# Patient Record
Sex: Female | Born: 1950 | Race: White | Hispanic: No | Marital: Married | Smoking: Never smoker
Health system: Southern US, Community
[De-identification: ages and names within clinical notes are randomized; demographics above are authoritative.]

## PROBLEM LIST (undated history)

## (undated) DIAGNOSIS — F0390 Unspecified dementia without behavioral disturbance: Secondary | ICD-10-CM

## (undated) DIAGNOSIS — T7840XA Allergy, unspecified, initial encounter: Secondary | ICD-10-CM

## (undated) DIAGNOSIS — M545 Low back pain, unspecified: Secondary | ICD-10-CM

## (undated) DIAGNOSIS — I10 Essential (primary) hypertension: Secondary | ICD-10-CM

## (undated) DIAGNOSIS — G4700 Insomnia, unspecified: Secondary | ICD-10-CM

## (undated) DIAGNOSIS — K219 Gastro-esophageal reflux disease without esophagitis: Secondary | ICD-10-CM

## (undated) DIAGNOSIS — F419 Anxiety disorder, unspecified: Secondary | ICD-10-CM

## (undated) HISTORY — DX: Low back pain: M54.5

## (undated) HISTORY — DX: Essential (primary) hypertension: I10

## (undated) HISTORY — PX: CATARACT EXTRACTION, BILATERAL: SHX1313

## (undated) HISTORY — DX: Low back pain, unspecified: M54.50

## (undated) HISTORY — DX: Gastro-esophageal reflux disease without esophagitis: K21.9

## (undated) HISTORY — DX: Unspecified dementia, unspecified severity, without behavioral disturbance, psychotic disturbance, mood disturbance, and anxiety: F03.90

## (undated) HISTORY — DX: Allergy, unspecified, initial encounter: T78.40XA

## (undated) HISTORY — DX: Anxiety disorder, unspecified: F41.9

## (undated) HISTORY — DX: Insomnia, unspecified: G47.00

---

## 2002-07-02 ENCOUNTER — Encounter: Payer: Self-pay | Admitting: Internal Medicine

## 2002-07-02 ENCOUNTER — Encounter: Admission: RE | Admit: 2002-07-02 | Discharge: 2002-07-02 | Payer: Self-pay | Admitting: Internal Medicine

## 2003-06-25 ENCOUNTER — Emergency Department (HOSPITAL_COMMUNITY): Admission: EM | Admit: 2003-06-25 | Discharge: 2003-06-25 | Payer: Self-pay | Admitting: Emergency Medicine

## 2003-07-02 ENCOUNTER — Encounter: Admission: RE | Admit: 2003-07-02 | Discharge: 2003-07-02 | Payer: Self-pay | Admitting: Internal Medicine

## 2004-02-11 ENCOUNTER — Other Ambulatory Visit: Admission: RE | Admit: 2004-02-11 | Discharge: 2004-02-11 | Payer: Self-pay | Admitting: Internal Medicine

## 2004-04-15 ENCOUNTER — Ambulatory Visit (HOSPITAL_COMMUNITY): Admission: RE | Admit: 2004-04-15 | Discharge: 2004-04-15 | Payer: Self-pay | Admitting: Gastroenterology

## 2004-04-15 ENCOUNTER — Encounter (INDEPENDENT_AMBULATORY_CARE_PROVIDER_SITE_OTHER): Payer: Self-pay | Admitting: Specialist

## 2004-07-02 ENCOUNTER — Encounter: Admission: RE | Admit: 2004-07-02 | Discharge: 2004-07-02 | Payer: Self-pay | Admitting: Internal Medicine

## 2005-07-06 ENCOUNTER — Encounter: Admission: RE | Admit: 2005-07-06 | Discharge: 2005-07-06 | Payer: Self-pay | Admitting: Internal Medicine

## 2006-03-15 HISTORY — PX: CHOLECYSTECTOMY: SHX55

## 2006-07-08 ENCOUNTER — Encounter: Admission: RE | Admit: 2006-07-08 | Discharge: 2006-07-08 | Payer: Self-pay | Admitting: Internal Medicine

## 2006-10-04 ENCOUNTER — Encounter: Admission: RE | Admit: 2006-10-04 | Discharge: 2006-10-04 | Payer: Self-pay | Admitting: Internal Medicine

## 2007-07-10 ENCOUNTER — Encounter: Admission: RE | Admit: 2007-07-10 | Discharge: 2007-07-10 | Payer: Self-pay | Admitting: Internal Medicine

## 2008-03-26 ENCOUNTER — Ambulatory Visit: Payer: Self-pay | Admitting: Internal Medicine

## 2008-04-25 ENCOUNTER — Ambulatory Visit: Payer: Self-pay | Admitting: Internal Medicine

## 2008-04-29 IMAGING — CR DG SINUSES COMPLETE 3+V
3 series · 3 of 3 positions shown · non-contrast
Comparison: none

CLINICAL DATA: Chronic sinus and right nasal congestion.  History of deviated septum. 
 DIAGNOSTIC SINUSES COMPLETE - 3 VIEW: 
 No comparison.

[view not recorded (1 of 3)]
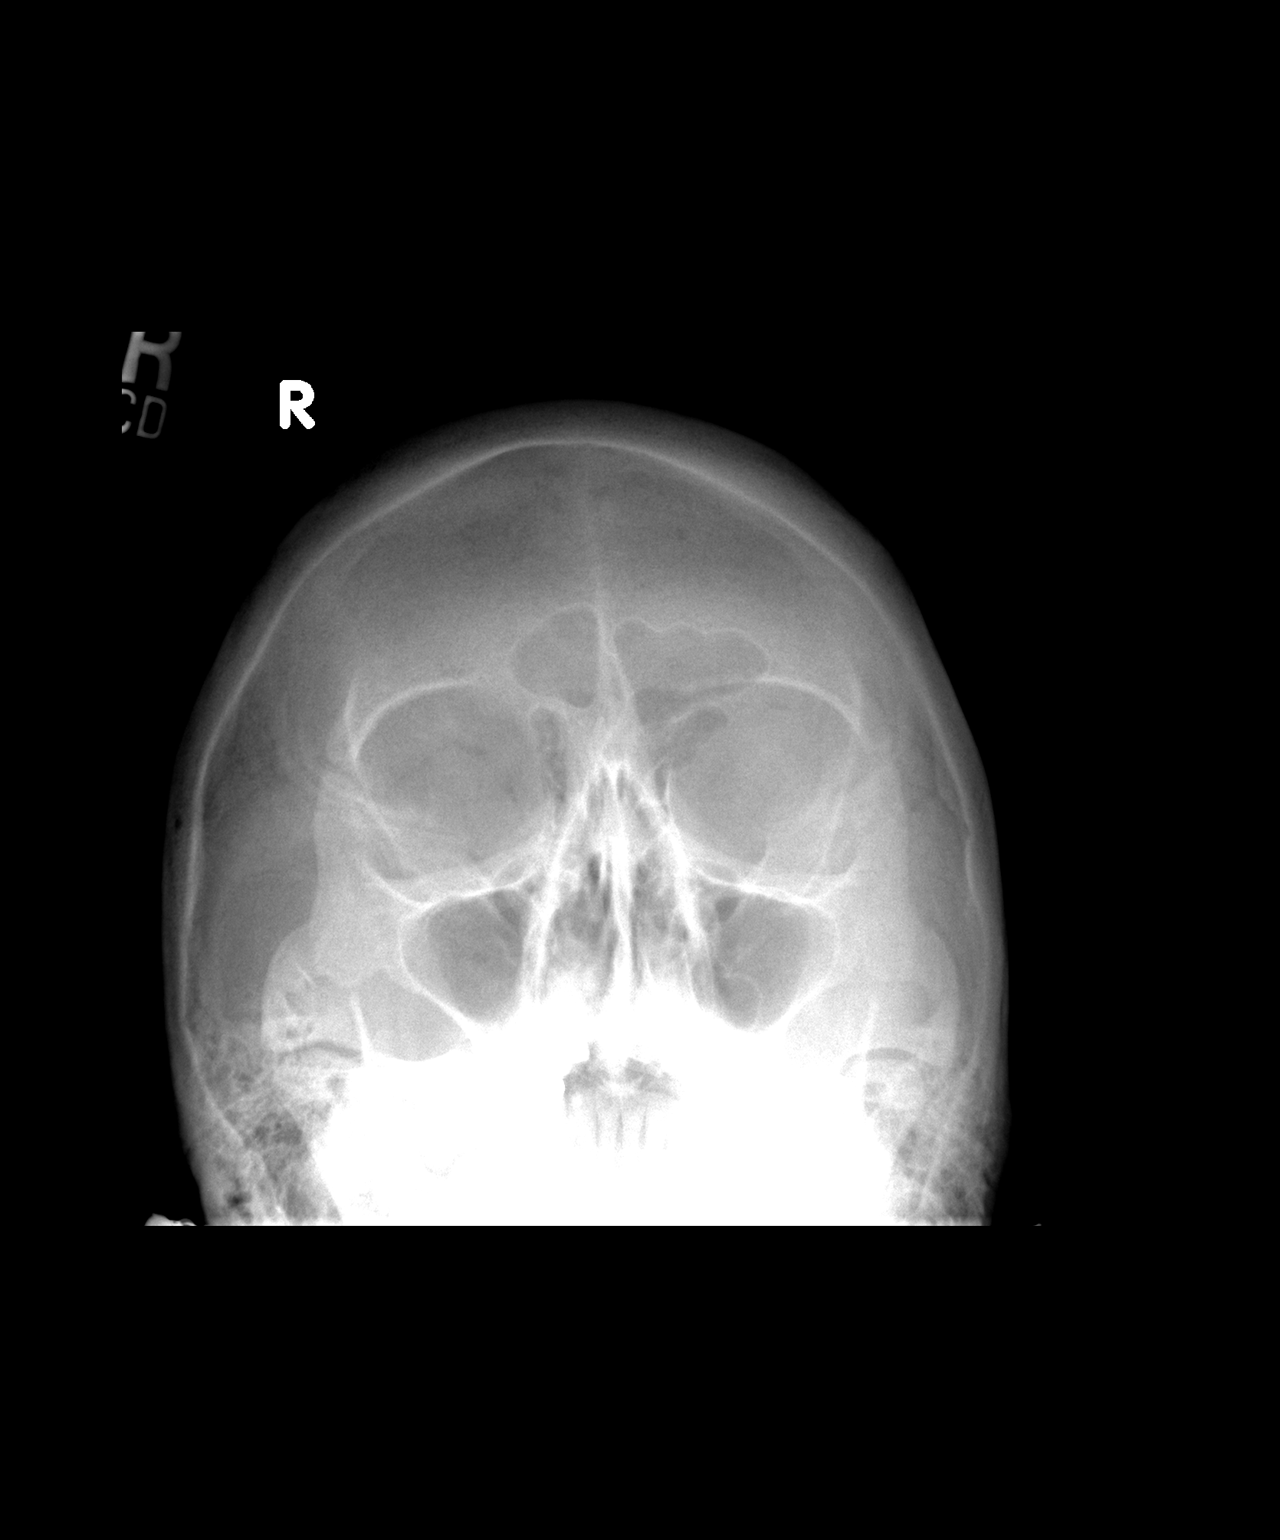

[view not recorded (2 of 3)]
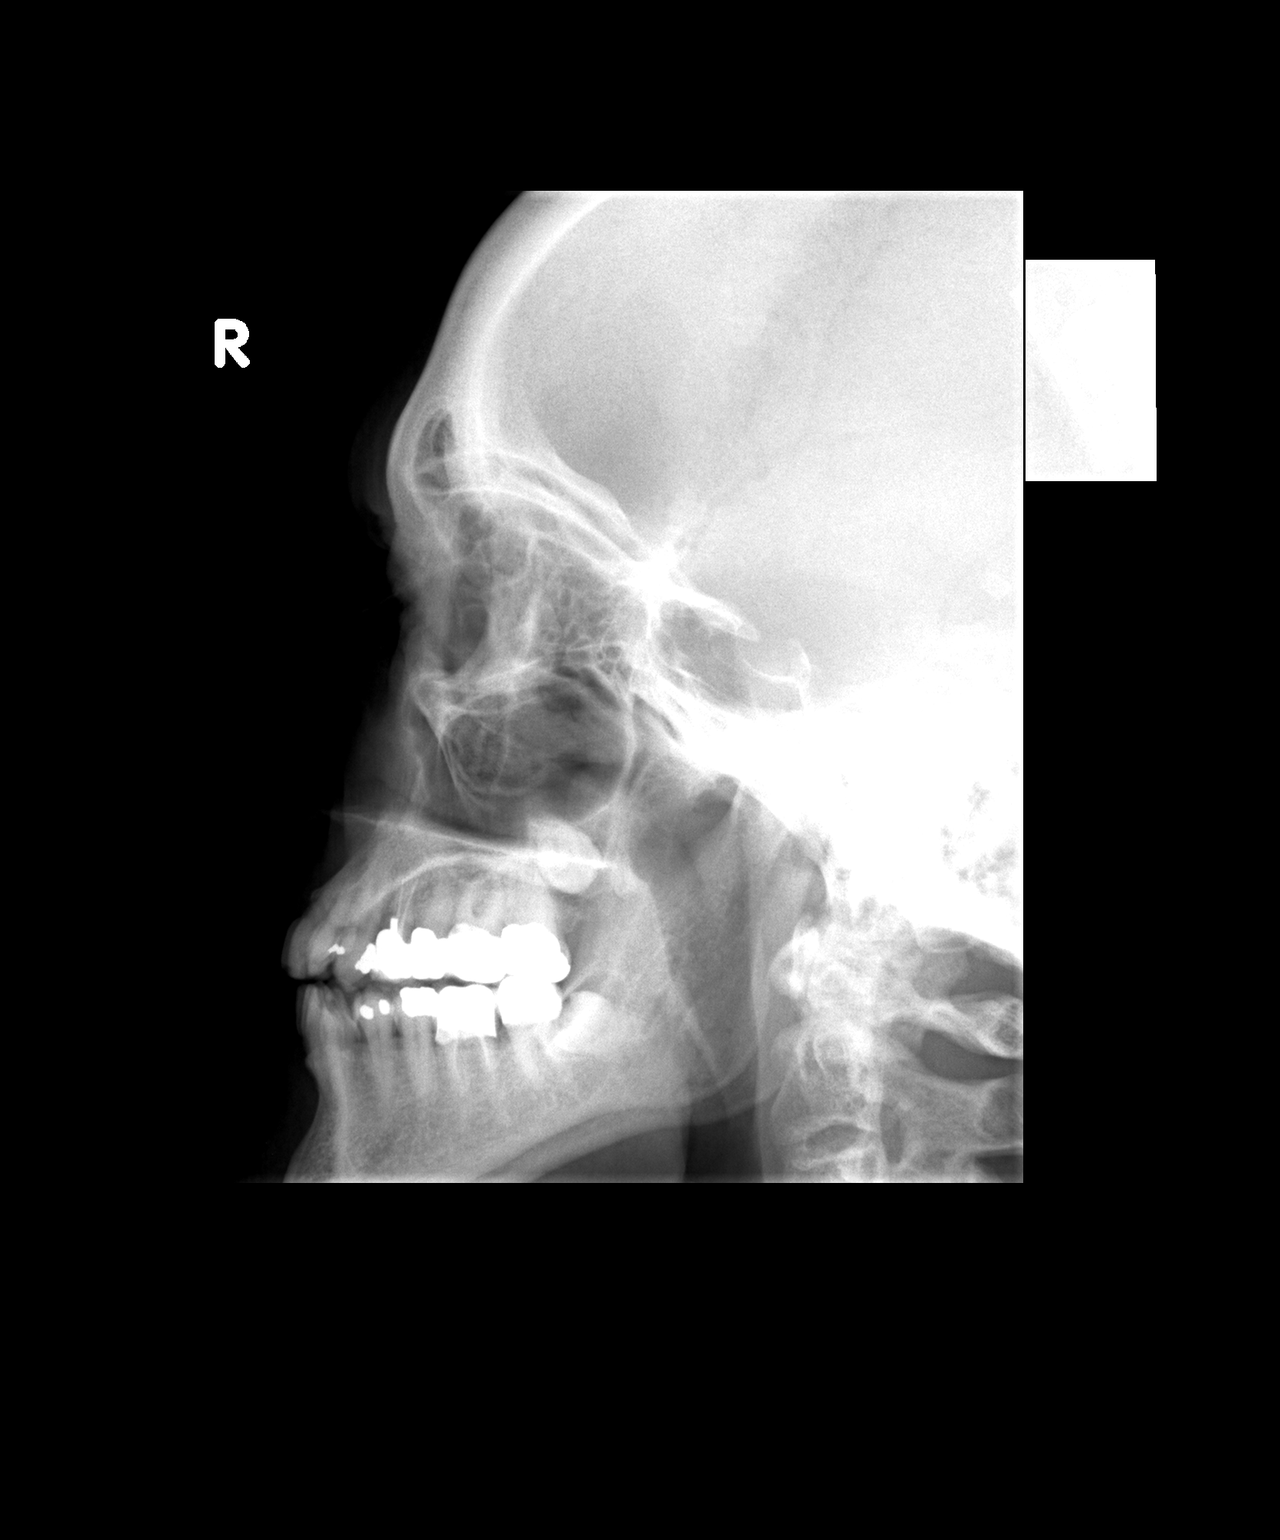

[view not recorded (3 of 3)]
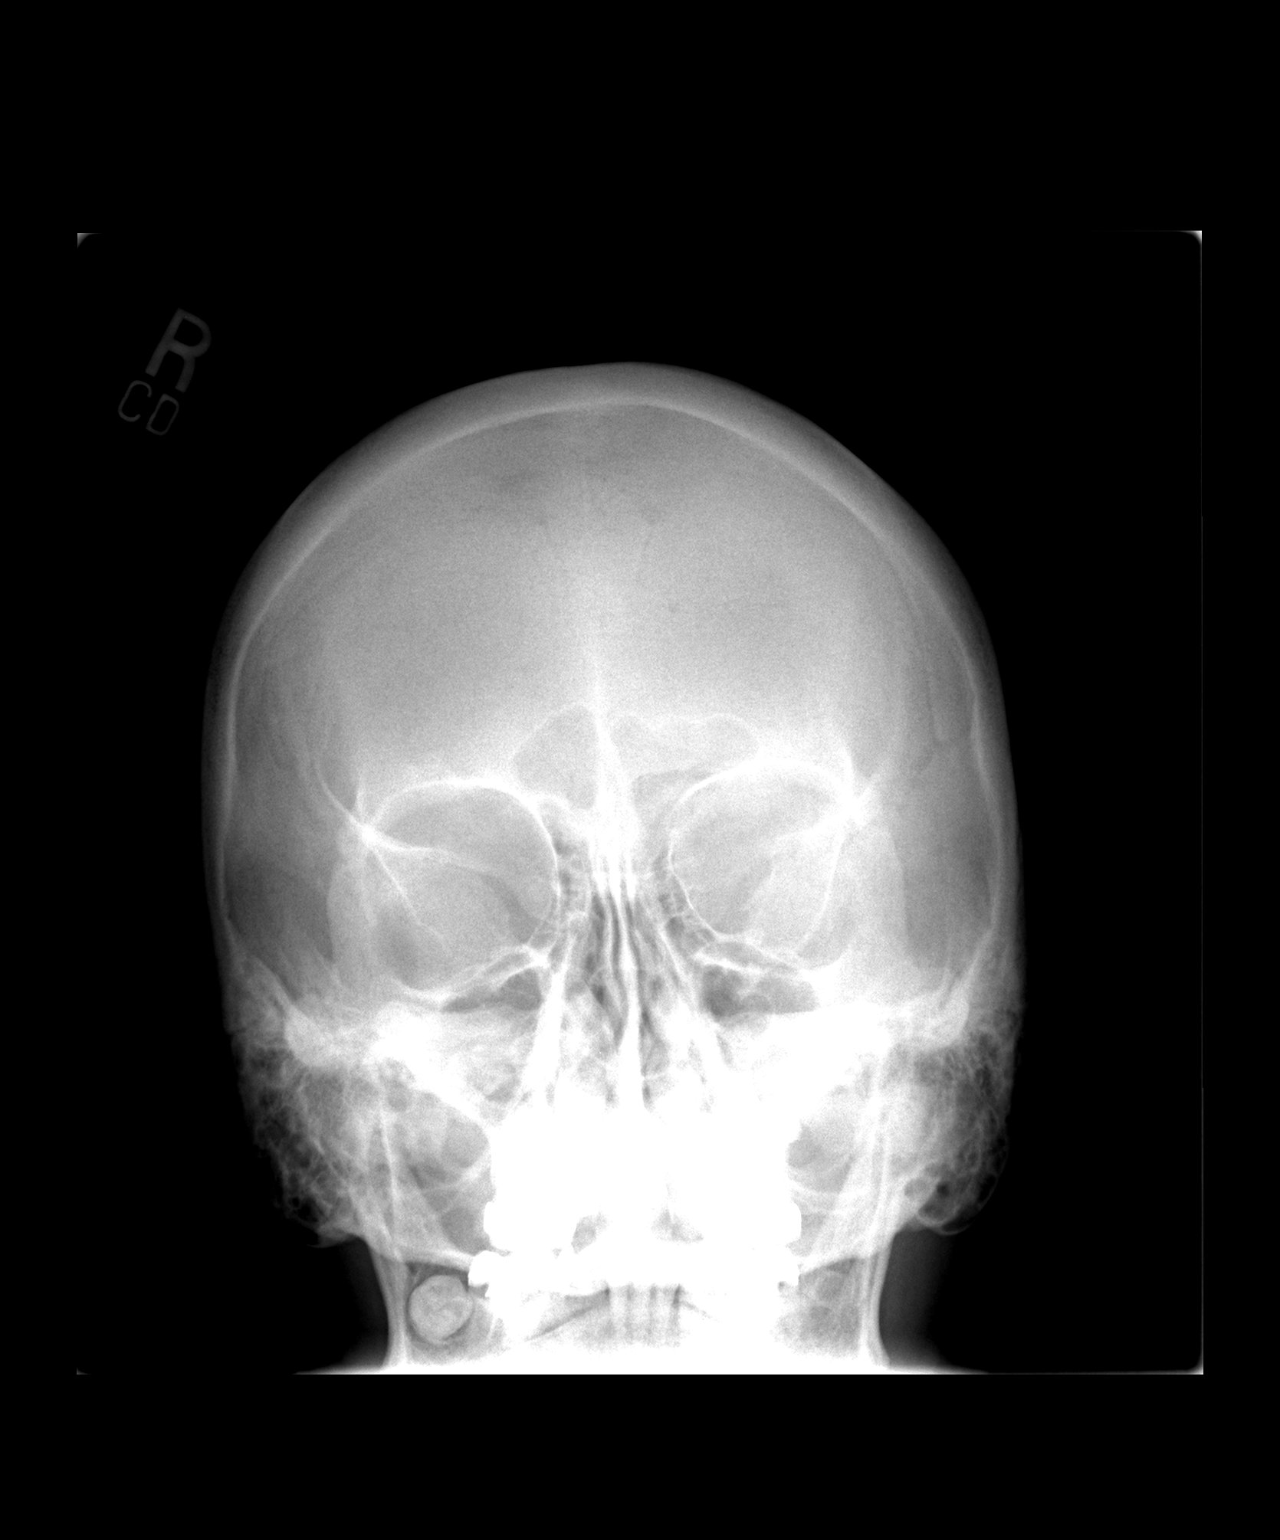

[3 of 3 positions shown; findings below may reference images not displayed]

FINDINGS: Visualized paranasal sinuses and bilateral mastoid air cells appear clear.
IMPRESSION: Negative.

## 2008-07-10 ENCOUNTER — Encounter: Admission: RE | Admit: 2008-07-10 | Discharge: 2008-07-10 | Payer: Self-pay | Admitting: Internal Medicine

## 2008-09-20 ENCOUNTER — Ambulatory Visit: Payer: Self-pay | Admitting: Internal Medicine

## 2008-09-23 ENCOUNTER — Encounter: Admission: RE | Admit: 2008-09-23 | Discharge: 2008-09-23 | Payer: Self-pay | Admitting: Internal Medicine

## 2008-10-04 ENCOUNTER — Ambulatory Visit: Payer: Self-pay | Admitting: Internal Medicine

## 2008-10-15 ENCOUNTER — Encounter: Admission: RE | Admit: 2008-10-15 | Discharge: 2008-10-15 | Payer: Self-pay | Admitting: Internal Medicine

## 2008-10-25 ENCOUNTER — Ambulatory Visit (HOSPITAL_COMMUNITY): Admission: RE | Admit: 2008-10-25 | Discharge: 2008-10-25 | Payer: Self-pay | Admitting: Internal Medicine

## 2008-11-19 ENCOUNTER — Encounter (INDEPENDENT_AMBULATORY_CARE_PROVIDER_SITE_OTHER): Payer: Self-pay | Admitting: General Surgery

## 2008-11-19 ENCOUNTER — Ambulatory Visit (HOSPITAL_COMMUNITY): Admission: RE | Admit: 2008-11-19 | Discharge: 2008-11-19 | Payer: Self-pay | Admitting: General Surgery

## 2008-12-02 ENCOUNTER — Ambulatory Visit: Payer: Self-pay | Admitting: Internal Medicine

## 2009-07-11 ENCOUNTER — Encounter: Admission: RE | Admit: 2009-07-11 | Discharge: 2009-07-11 | Payer: Self-pay | Admitting: Internal Medicine

## 2009-10-08 ENCOUNTER — Ambulatory Visit: Payer: Self-pay | Admitting: Internal Medicine

## 2009-12-04 ENCOUNTER — Ambulatory Visit
Admission: RE | Admit: 2009-12-04 | Discharge: 2009-12-04 | Payer: Self-pay | Source: Home / Self Care | Admitting: Internal Medicine

## 2009-12-04 ENCOUNTER — Ambulatory Visit: Payer: Self-pay | Admitting: Internal Medicine

## 2010-04-06 ENCOUNTER — Encounter: Payer: Self-pay | Admitting: Internal Medicine

## 2010-06-17 ENCOUNTER — Other Ambulatory Visit: Payer: Self-pay | Admitting: Internal Medicine

## 2010-06-17 DIAGNOSIS — Z1231 Encounter for screening mammogram for malignant neoplasm of breast: Secondary | ICD-10-CM

## 2010-06-19 LAB — BASIC METABOLIC PANEL
BUN: 10 mg/dL (ref 6–23)
Calcium: 9.6 mg/dL (ref 8.4–10.5)
Chloride: 107 mEq/L (ref 96–112)
Creatinine, Ser: 0.68 mg/dL (ref 0.4–1.2)
GFR calc Af Amer: >60 mL/min (ref >60)
GFR calc non Af Amer: >60 mL/min (ref >60)

## 2010-06-19 LAB — CBC
MCV: 88.1 fL (ref 78.0–100.0)
Platelets: 210 10*3/uL (ref 150–400)
RBC: 4.24 MIL/uL (ref 3.87–5.11)
WBC: 5 10*3/uL (ref 4.0–10.5)

## 2010-07-13 ENCOUNTER — Ambulatory Visit
Admission: RE | Admit: 2010-07-13 | Discharge: 2010-07-13 | Disposition: A | Discharge status: Home / Self Care | Payer: BC Managed Care – PPO | Source: Ambulatory Visit | Attending: Internal Medicine | Admitting: Internal Medicine

## 2010-07-13 DIAGNOSIS — Z1231 Encounter for screening mammogram for malignant neoplasm of breast: Secondary | ICD-10-CM

## 2010-07-31 NOTE — Op Note (Signed)
NAMEBRIXTON, Darlene Dorsey              ACCOUNT NO.:  [id number]   MEDICAL RECORD NO.:  [id number]          PATIENT TYPE:  AMB   LOCATION:  ENDO                         FACILITY:  [facility]   PHYSICIAN:  Anselmo Rod, M.D.  DATE OF BIRTH:  08-17-1950   DATE OF PROCEDURE:  04/15/2004  DATE OF DISCHARGE:                                 OPERATIVE REPORT   PROCEDURE PERFORMED:  Colonoscopy with cold biopsies x4.   ENDOSCOPIST:  Anselmo Rod, M.D.   INSTRUMENT USED:  Olympus video colonoscope.   INDICATION FOR PROCEDURE:  A 60 year old white female with a history of  chronic constipation and a history of using cascara sagrada on a regular  basis, undergoing a screening colonoscopy to rule out colonic polyps,  masses, etc.   PREPROCEDURE PREPARATION:  Informed consent was procured from the patient.  The patient was fasted for eight hours prior to the procedure and prepped  with a bottle of magnesium citrate and a gallon of GoLYTELY the night prior  to the procedure.  The risks and benefits of the procedure, including a 10%  miss rate of cancer and polyps, were discussed with her as well.   PREPROCEDURE PHYSICAL:  VITAL SIGNS:  The patient had stable vital signs.  NECK:  Supple.  CHEST:  Clear to auscultation.  S1, S2 regular.  ABDOMEN:  Soft with normal bowel sounds.   DESCRIPTION OF PROCEDURE:  The patient was placed in the left lateral  decubitus position and sedated with 100 mg of Demerol and 7.5 mg of Versed  in slow incremental doses.  Once the patient was adequately sedate and  maintained on low-flow oxygen and continuous cardiac monitoring, the Olympus  video colonoscope was advanced from the rectum to the cecum.  There was  evidence of severe diffuse melanosis coli throughout the colon with more  prominent changes on the right side.  Multiple small sessile polyps were  biopsied from the rectosigmoid colon.  Retroflexion in the rectum revealed  no abnormalities.  The patient  tolerated the procedure well without  complications.  There was some residual stool in the colon.  Multiple washes  were done.  No large masses were identified.  There was no evidence of  diverticulosis.  The appendiceal orifice and the ileocecal valve were  visualized and photographed.  The terminal ileum appeared normal.   IMPRESSION:  1.  Severe diffuse melanosis coli with more prominent change in the right      colon compared to the left side.  2.  Multiple small sessile polyps biopsied from rectosigmoid colon.  3.  Normal-appearing terminal ileum.   RECOMMENDATIONS:  1.  Await pathology results.  2.  Avoid nonsteroidals, including aspirin, for the next two weeks.  3.  Outpatient follow-up in the next two weeks for further recommendations.      JNM/MEDQ  D:  04/15/2004  T:  04/15/2004  Job:  [id number]   cc:   Marcene Duos, M.D.  [facility]  [place]  [place] [address]  Fax: [phone]

## 2010-12-14 ENCOUNTER — Encounter: Payer: BC Managed Care – PPO | Admitting: Internal Medicine

## 2010-12-14 ENCOUNTER — Other Ambulatory Visit: Payer: BC Managed Care – PPO | Admitting: Internal Medicine

## 2010-12-28 ENCOUNTER — Encounter: Payer: BC Managed Care – PPO | Admitting: Internal Medicine

## 2010-12-28 ENCOUNTER — Other Ambulatory Visit: Payer: BC Managed Care – PPO | Admitting: Internal Medicine

## 2010-12-30 ENCOUNTER — Encounter: Payer: Self-pay | Admitting: Internal Medicine

## 2010-12-31 ENCOUNTER — Other Ambulatory Visit: Payer: BC Managed Care – PPO | Admitting: Internal Medicine

## 2010-12-31 ENCOUNTER — Encounter: Payer: Self-pay | Admitting: Internal Medicine

## 2010-12-31 ENCOUNTER — Ambulatory Visit (INDEPENDENT_AMBULATORY_CARE_PROVIDER_SITE_OTHER): Payer: BC Managed Care – PPO | Admitting: Internal Medicine

## 2010-12-31 VITALS — BP 164/98 | HR 80 | Resp 16 | Ht 65.5 in | Wt 164.0 lb

## 2010-12-31 DIAGNOSIS — J309 Allergic rhinitis, unspecified: Secondary | ICD-10-CM

## 2010-12-31 DIAGNOSIS — K219 Gastro-esophageal reflux disease without esophagitis: Secondary | ICD-10-CM

## 2010-12-31 DIAGNOSIS — Z0000 Encounter for general adult medical examination without abnormal findings: Secondary | ICD-10-CM

## 2010-12-31 DIAGNOSIS — G4700 Insomnia, unspecified: Secondary | ICD-10-CM

## 2010-12-31 DIAGNOSIS — F411 Generalized anxiety disorder: Secondary | ICD-10-CM

## 2010-12-31 DIAGNOSIS — Z23 Encounter for immunization: Secondary | ICD-10-CM

## 2010-12-31 DIAGNOSIS — F419 Anxiety disorder, unspecified: Secondary | ICD-10-CM

## 2010-12-31 LAB — COMPREHENSIVE METABOLIC PANEL
ALT: 25 U/L (ref 0–35)
AST: 27 U/L (ref 0–37)
Albumin: 4.7 g/dL (ref 3.5–5.2)
BUN: 13 mg/dL (ref 6–23)
CO2: 24 mEq/L (ref 19–32)
Calcium: 9.4 mg/dL (ref 8.4–10.5)
Chloride: 107 mEq/L (ref 96–112)
Potassium: 4.2 mEq/L (ref 3.5–5.3)

## 2010-12-31 LAB — POCT URINALYSIS DIPSTICK
Bilirubin, UA: NEGATIVE
Glucose, UA: NEGATIVE
Leukocytes, UA: NEGATIVE
Nitrite, UA: NEGATIVE
Urobilinogen, UA: NEGATIVE

## 2010-12-31 LAB — CBC WITH DIFFERENTIAL/PLATELET
Basophils Absolute: 0 10*3/uL (ref 0.0–0.1)
HCT: 39.3 % (ref 36.0–46.0)
Lymphocytes Relative: 34 % (ref 12–46)
Lymphs Abs: 1.9 10*3/uL (ref 0.7–4.0)
MCV: 87.1 fL (ref 78.0–100.0)
Monocytes Absolute: 0.5 10*3/uL (ref 0.1–1.0)
Neutro Abs: 3.2 10*3/uL (ref 1.7–7.7)
RBC: 4.51 MIL/uL (ref 3.87–5.11)
RDW: 13.5 % (ref 11.5–15.5)
WBC: 5.6 10*3/uL (ref 4.0–10.5)

## 2010-12-31 LAB — LIPID PANEL
LDL Cholesterol: 122 mg/dL — ABNORMAL HIGH (ref 0–99)
VLDL: 21 mg/dL (ref 0–40)

## 2011-01-01 LAB — VITAMIN D 25 HYDROXY (VIT D DEFICIENCY, FRACTURES): Vit D, 25-Hydroxy: 29 ng/mL — ABNORMAL LOW (ref 30–89)

## 2011-01-13 DIAGNOSIS — F419 Anxiety disorder, unspecified: Secondary | ICD-10-CM | POA: Insufficient documentation

## 2011-01-13 DIAGNOSIS — G4700 Insomnia, unspecified: Secondary | ICD-10-CM | POA: Insufficient documentation

## 2011-01-13 DIAGNOSIS — K219 Gastro-esophageal reflux disease without esophagitis: Secondary | ICD-10-CM | POA: Insufficient documentation

## 2011-01-13 NOTE — Progress Notes (Signed)
  Subjective:    Patient ID: Darlene Dorsey, female    DOB: 04-19-1950, 60 y.o.   MRN: [id number]  HPI 60 year old white female in today for health maintenance and evaluation of medical problems. History of GE reflux, insomnia, anxiety, allergic rhinitis. History of hepatitis A in 2010. Had laparoscopic cholecystectomy September 2010. Given influenza immunization today. Had tetanus immunization 2007. Is intolerant of Motrin - causes GI upset.    Review of Systems  Constitutional: Negative.   HENT: Negative.   Eyes: Negative.   Respiratory: Negative.   Cardiovascular: Negative.   Gastrointestinal: Negative.   Neurological: Negative.   Hematological: Negative.   Psychiatric/Behavioral:       Worries a lot about her health and her family. Not really pleased living in [place]. Prefers [place] where her son and grandchildren reside       Objective:   Physical Exam  Vitals reviewed. Constitutional: She is oriented to person, place, and time. She appears well-developed and well-nourished.  HENT:  Head: Atraumatic.  Right Ear: External ear normal.  Left Ear: External ear normal.  Mouth/Throat: Oropharynx is clear and moist.  Eyes: EOM are normal. Pupils are equal, round, and reactive to light.  Neck: Neck supple. No JVD present. No thyromegaly present.  Cardiovascular: Normal rate, regular rhythm and normal heart sounds.   Pulmonary/Chest: Effort normal and breath sounds normal. She has no wheezes. She has no rales.       Breasts normal female  Abdominal: Soft. Bowel sounds are normal. She exhibits no mass. There is no tenderness. There is no rebound.  Genitourinary:       Deferred  Musculoskeletal: Normal range of motion. She exhibits no edema.  Lymphadenopathy:    She has no cervical adenopathy.  Neurological: She is alert and oriented to person, place, and time. She has normal reflexes. No cranial nerve deficit. Coordination normal.  Skin: Skin is warm and dry.  No rash noted.  Psychiatric: Her behavior is normal. Judgment and thought content normal.       Dysphoric mood          Assessment & Plan:  Anxiety  GE reflux  Allergic rhinitis  History of insomnia  History of low back pain  Plan: Continue Klonopin 0.5 mg 1-1/2 tablets at bedtime. No longer taking Protonix for GE reflux. May take over-the-counter Prilosec instead if needed. Return in one year or as needed. Influenza immunization given today. Had colonoscopy in 2006

## 2011-01-13 NOTE — Patient Instructions (Signed)
Continue Klonopin for anxiety and insomnia. Return one year or as needed.

## 2011-04-12 ENCOUNTER — Other Ambulatory Visit: Payer: Self-pay

## 2011-04-12 MED ORDER — CLONAZEPAM 0.5 MG PO TABS: 0.5000 mg | ORAL_TABLET | Freq: Two times a day (BID) | ORAL | Status: DC | PRN

## 2011-04-26 ENCOUNTER — Telehealth: Payer: Self-pay | Admitting: Internal Medicine

## 2011-04-27 DIAGNOSIS — J329 Chronic sinusitis, unspecified: Secondary | ICD-10-CM

## 2011-04-27 NOTE — Telephone Encounter (Signed)
Per Dr. Lenord Fellers phoned in Z-pak, 2 pills today, 1 a day thereafter until pak completed.  No refills. Phoned to [place], [place]

## 2011-06-04 ENCOUNTER — Encounter: Payer: Self-pay | Admitting: Internal Medicine

## 2011-06-04 ENCOUNTER — Ambulatory Visit (INDEPENDENT_AMBULATORY_CARE_PROVIDER_SITE_OTHER): Payer: BC Managed Care – PPO | Admitting: Internal Medicine

## 2011-06-04 DIAGNOSIS — R5383 Other fatigue: Secondary | ICD-10-CM

## 2011-06-04 DIAGNOSIS — R5381 Other malaise: Secondary | ICD-10-CM

## 2011-06-04 DIAGNOSIS — R109 Unspecified abdominal pain: Secondary | ICD-10-CM

## 2011-06-04 DIAGNOSIS — R1013 Epigastric pain: Secondary | ICD-10-CM

## 2011-06-04 NOTE — Patient Instructions (Addendum)
Take Klonopin 3 times daily as needed for anxiety. Start Restoril at bedtime instead of Klonopin. Takes Protonix daily for acid reduction. Keep appointment to see Dr. Loreta Ave next week.

## 2011-06-04 NOTE — Progress Notes (Signed)
  Subjective:    Patient ID: Darlene Dorsey, female    DOB: [birth year], 61 y.o.   MRN: [id number]  HPI 61 year old white female in today for evaluation of epigastric discomfort. Long-standing history of GE reflux. Has been on Protonix in the past but currently just taking Zantac. History of insomnia. Patient had laparoscopic cholecystectomy by Dr. Abbey Chatters September 2010& had colonoscopy by Dr. Loreta Ave February 2006. Patient had hepatitis A 2010. History of low back pain allergic rhinitis.  Since last year she and her husband have moved to [facility], were her husband grew up. They remodeled his family's home. She really doesn't like it there. They're not a lot of ladies  to do things with and she does not enjoy volunteeering at the church. One son and his wife reside in [place]. Another son, his wife and grandchildren reside here in [place]. I think she misses [place]. She is complaining of abdominal bloating. She's gained some weight. Previously weighed 154 pounds July 2011. Says that as the day goes on her abdomen appears to "swell". Feels full by the end of the day. Has a wedding to go to in one month and is unhappy with her weight. Denies constipation. Continues to have issues with insomnia. Has been on Klonopin since 2009. Was taking up to a milligram of Klonopin at night to sleep but that doesn't work. Sometimes takes Klonopin during the day to deal with anxiety. Describes husband is controlling. I don't know why she stopped taking Protonix. I last saw her in the fall of 2011. At that time TSH was normal, liver functions were normal and CBC was normal. Fasting glucose was 110. Cholesterol was normal with the exception of an LDL of 108. Patient also complaining that her hair looks coarse.  Family history father died with pneumonia with history of heart disease and Alzheimer's disease. 2 brothers in good health one sister in good health. Mother still living. Mother with history of hypertension and  ulcers. Father with history of ulcers. One sister with diabetes and hypertension. One brother with diabetes. Patient does not smoke. Social alcohol consumption.  Patient has been allergy tested in 2003. Is allergic to dust and dust mites some pollens and molds.    Review of Systems as above      Objective:   Physical Exam abdomen is soft and nondistended. Bowel sounds are active. Patient has some mild epigastric tenderness without rebound tenderness. No hepatosplenomegaly. No masses .        Assessment & Plan:   Epigastric pain  Anxiety  Complain of abdominal bloating  Insomnia  Plan: Change Klonopin at bedtime 2 Restoril 30 mg at bedtime. Patient may take Klonopin 0.5 mg 3 times a day when necessary anxiety. Start Protonix 40 mg daily. At her request, phentermine 37.5 mg proceed #30) 1 by mouth daily. Reminded her this would not be refilled at all. This is to help jumpstart her weight loss. I feel this lady is under a fair amount of stress. We have drawn CBC with differential, C. met, TSH and H. pylori antibody. Appointment made to see Dr. Loreta Ave next week

## 2011-06-05 LAB — CBC WITH DIFFERENTIAL/PLATELET
Eosinophils Absolute: 0 10*3/uL (ref 0.0–0.7)
Lymphocytes Relative: 28 % (ref 12–46)
Lymphs Abs: 1.5 10*3/uL (ref 0.7–4.0)
MCH: 29 pg (ref 26.0–34.0)
Neutro Abs: 3.6 10*3/uL (ref 1.7–7.7)
Neutrophils Relative %: 66 % (ref 43–77)
Platelets: 261 10*3/uL (ref 150–400)
RBC: 4.51 MIL/uL (ref 3.87–5.11)
WBC: 5.4 10*3/uL (ref 4.0–10.5)

## 2011-06-05 LAB — TSH: TSH: 2.74 u[IU]/mL (ref 0.350–4.500)

## 2011-06-05 LAB — COMPREHENSIVE METABOLIC PANEL
ALT: 36 U/L — ABNORMAL HIGH (ref 0–35)
AST: 30 U/L (ref 0–37)
Calcium: 9.5 mg/dL (ref 8.4–10.5)
Chloride: 109 mEq/L (ref 96–112)
Creat: 0.6 mg/dL (ref 0.50–1.10)
Sodium: 146 mEq/L — ABNORMAL HIGH (ref 135–145)

## 2011-06-07 ENCOUNTER — Telehealth: Payer: Self-pay

## 2011-06-07 NOTE — Telephone Encounter (Signed)
Patient should go to Dr. Shelle Iron for a sleep consult.

## 2011-06-07 NOTE — Telephone Encounter (Signed)
States Temazepam 30 mg is not helping her sleep at all, so she went back to using Clonazepam, which gave her some sleep, but not all night.

## 2011-06-07 NOTE — Telephone Encounter (Signed)
Patient informed. Not sure if she will do this or not.

## 2011-06-10 ENCOUNTER — Other Ambulatory Visit: Payer: Self-pay | Admitting: Internal Medicine

## 2011-06-10 DIAGNOSIS — Z1231 Encounter for screening mammogram for malignant neoplasm of breast: Secondary | ICD-10-CM

## 2011-06-17 ENCOUNTER — Telehealth: Payer: Self-pay

## 2011-06-18 NOTE — Telephone Encounter (Signed)
Patient already has a scheduled appointment with Dr. Loreta Ave.

## 2011-07-19 ENCOUNTER — Ambulatory Visit: Payer: BC Managed Care – PPO

## 2011-08-02 ENCOUNTER — Ambulatory Visit: Payer: BC Managed Care – PPO

## 2011-12-06 ENCOUNTER — Other Ambulatory Visit: Payer: Self-pay

## 2011-12-06 MED ORDER — CLONAZEPAM 0.5 MG PO TABS: 0.5000 mg | ORAL_TABLET | Freq: Two times a day (BID) | ORAL | Status: DC | PRN

## 2012-01-24 ENCOUNTER — Other Ambulatory Visit: Payer: Self-pay

## 2012-01-24 MED ORDER — CLONAZEPAM 0.5 MG PO TABS: 0.5000 mg | ORAL_TABLET | Freq: Two times a day (BID) | ORAL | Status: DC | PRN

## 2012-01-31 ENCOUNTER — Telehealth: Payer: Self-pay | Admitting: Internal Medicine

## 2012-02-24 ENCOUNTER — Ambulatory Visit (INDEPENDENT_AMBULATORY_CARE_PROVIDER_SITE_OTHER): Payer: BC Managed Care – PPO | Admitting: Internal Medicine

## 2012-02-24 ENCOUNTER — Encounter: Payer: Self-pay | Admitting: Internal Medicine

## 2012-02-24 ENCOUNTER — Other Ambulatory Visit (HOSPITAL_COMMUNITY)
Admission: RE | Admit: 2012-02-24 | Discharge: 2012-02-24 | Disposition: A | Discharge status: Home / Self Care | Payer: BC Managed Care – PPO | Source: Ambulatory Visit | Attending: Internal Medicine | Admitting: Internal Medicine

## 2012-02-24 VITALS — BP 152/100 | HR 76 | Temp 98.1°F | Ht 64.5 in | Wt 162.0 lb

## 2012-02-24 DIAGNOSIS — R030 Elevated blood-pressure reading, without diagnosis of hypertension: Secondary | ICD-10-CM

## 2012-02-24 DIAGNOSIS — Z23 Encounter for immunization: Secondary | ICD-10-CM

## 2012-02-24 DIAGNOSIS — K219 Gastro-esophageal reflux disease without esophagitis: Secondary | ICD-10-CM

## 2012-02-24 DIAGNOSIS — J309 Allergic rhinitis, unspecified: Secondary | ICD-10-CM

## 2012-02-24 DIAGNOSIS — F419 Anxiety disorder, unspecified: Secondary | ICD-10-CM

## 2012-02-24 DIAGNOSIS — Z0000 Encounter for general adult medical examination without abnormal findings: Secondary | ICD-10-CM

## 2012-02-24 DIAGNOSIS — E785 Hyperlipidemia, unspecified: Secondary | ICD-10-CM

## 2012-02-24 DIAGNOSIS — IMO0001 Reserved for inherently not codable concepts without codable children: Secondary | ICD-10-CM

## 2012-02-24 DIAGNOSIS — Z01419 Encounter for gynecological examination (general) (routine) without abnormal findings: Secondary | ICD-10-CM | POA: Insufficient documentation

## 2012-02-24 DIAGNOSIS — F411 Generalized anxiety disorder: Secondary | ICD-10-CM

## 2012-02-24 DIAGNOSIS — Z124 Encounter for screening for malignant neoplasm of cervix: Secondary | ICD-10-CM

## 2012-02-24 LAB — LIPID PANEL
Cholesterol: 238 mg/dL — ABNORMAL HIGH (ref 0–200)
Total CHOL/HDL Ratio: 3.2 Ratio
Triglycerides: 90 mg/dL (ref <150)
VLDL: 18 mg/dL (ref 0–40)

## 2012-02-24 LAB — POCT URINALYSIS DIPSTICK
Bilirubin, UA: NEGATIVE
Ketones, UA: NEGATIVE
Protein, UA: NEGATIVE
Spec Grav, UA: <=1.005
pH, UA: 6

## 2012-02-24 LAB — TSH: TSH: 1.696 u[IU]/mL (ref 0.350–4.500)

## 2012-02-24 MED ORDER — CLONAZEPAM 0.5 MG PO TABS: 0.5000 mg | ORAL_TABLET | ORAL | Status: DC

## 2012-02-24 NOTE — Patient Instructions (Addendum)
Change Klonopin to 0.5 mg every morning and 2 x 0.5 mg tablet at bedtime. Use Flonase nasal spray. Take Sterapred DS 10 mg 6 day dosepak for allergic rhinitis. Consider allergy testing if symptoms persist. Return in 6 months.

## 2012-05-20 ENCOUNTER — Encounter: Payer: Self-pay | Admitting: Internal Medicine

## 2012-05-21 ENCOUNTER — Encounter: Payer: Self-pay | Admitting: Internal Medicine

## 2012-05-21 DIAGNOSIS — J309 Allergic rhinitis, unspecified: Secondary | ICD-10-CM | POA: Insufficient documentation

## 2012-05-21 DIAGNOSIS — E785 Hyperlipidemia, unspecified: Secondary | ICD-10-CM | POA: Insufficient documentation

## 2012-05-21 NOTE — Progress Notes (Signed)
Subjective:    Patient ID: Darlene Dorsey, female    DOB: 11/16/1950, 62 y.o.   MRN: [id number]  HPI and 62 year old white female with history of anxiety in today for health maintenance and evaluation of medical problems. She has a history of insomnia, allergic rhinitis, GE reflux. History of hepatitis A infection in 2010. Had laparoscopic cholecystectomy in September 2010. Had tetanus immunization in 2007. Is intolerant of Motrin causes GI upset.  Her blood pressure is elevated today and was elevated at physical exam in 2012. Patient says she is anxious. Prefers living in [place] as opposed to [place] where she resides now and is her husband's home town. Formerly lived in [place] for a number of years. They are traveling back and forth. One of her sons resides here with his family. Another son resides in [place]. 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  Nonsmoker: Social alcohol consumption. 2 adult sons. Married.  She has had allergy testing by Dr. Corinda Gubler were in 2003 and was found to be very atopic. Had large reactions to dust, dust mites, some pollen and mold sensitivity. She was advised to have immunotherapy but never followed through with that. She had colonoscopy in 2006 but has not had repeat colonoscopy recommended in 2011.  Family history: Father died at 87 of pneumonia, heart problems and Alzheimer's disease. Mother living in her early 36s in good health. 2 brothers and one sister. Her only sister and one brother have diabetes mellitus. Mother and sister have hypertension.    Review of Systems  Constitutional: Negative.   HENT: Negative.   Eyes: Negative.   Respiratory: Negative.   Gastrointestinal:       History of GE reflux  Endocrine: Negative.   Genitourinary: Negative.   Neurological: Negative.   Hematological: Negative.   Psychiatric/Behavioral:       Anxiety and insomnia. Worries a lot.        Objective:   Physical Exam  Vitals reviewed. Constitutional: She is oriented to person, place, and time. She appears well-developed and well-nourished. No distress.  HENT:  Head: Normocephalic and atraumatic.  Right Ear: External ear normal.  Left Ear: External ear normal.  Eyes: Conjunctivae and EOM are normal. Pupils are equal, round, and reactive to light. Right eye exhibits no discharge. Left eye exhibits no discharge.  Neck: Neck supple. No JVD present. No thyromegaly present.  Cardiovascular: Normal rate, regular rhythm, normal heart sounds and intact distal pulses.   No murmur heard. Pulmonary/Chest: Effort normal and breath sounds normal. No respiratory distress. She has no wheezes. She exhibits no tenderness.  Breasts normal female  Abdominal: Soft. Bowel sounds are normal. She exhibits no mass. There is no tenderness. There is no rebound and no guarding.  Musculoskeletal: She exhibits no edema.  Neurological: She is alert and oriented to person, place, and time. She has normal reflexes. No cranial nerve deficit. Coordination normal.  Skin: Skin is warm and dry. No rash noted. She is not diaphoretic.  Psychiatric: She has a normal mood and affect. Her behavior is normal. Judgment and thought content normal.  Anxious          Assessment & Plan:  Anxiety-continue Klonopin see below  Insomnia-continue Klonopin 0.5 mg every morning and 1 mg at bedtime  Hyperlipidemia-needs to work on diet exercise and repeat lipid panel in 6 months  Elevated blood pressure-check at home and call if persistently elevated  History of GE reflux-  continue generic Protonix.  History of allergic rhinitis-not responding to over-the-counter medication. Has had a lot of nasal congestion recently. Treat with Sterapred DS 10 mg 6 day dosepak. Use Flonase nasal spray daily  Plan: Patient is to purchase home blood pressure cuff and check blood pressure at home. Is to call if persistently elevated.  Continue Klonopin for anxiety and insomnia. Return in 6 months.

## 2012-08-28 ENCOUNTER — Ambulatory Visit: Payer: BC Managed Care – PPO | Admitting: Internal Medicine

## 2012-09-04 ENCOUNTER — Other Ambulatory Visit: Payer: Self-pay

## 2012-09-04 DIAGNOSIS — F419 Anxiety disorder, unspecified: Secondary | ICD-10-CM

## 2012-09-04 MED ORDER — CLONAZEPAM 0.5 MG PO TABS: 0.5000 mg | ORAL_TABLET | ORAL | Status: DC

## 2012-10-10 ENCOUNTER — Other Ambulatory Visit: Payer: BC Managed Care – PPO | Admitting: Internal Medicine

## 2012-10-10 ENCOUNTER — Ambulatory Visit: Payer: Self-pay | Admitting: Internal Medicine

## 2012-11-17 ENCOUNTER — Other Ambulatory Visit: Payer: BC Managed Care – PPO | Admitting: Internal Medicine

## 2012-11-17 DIAGNOSIS — E785 Hyperlipidemia, unspecified: Secondary | ICD-10-CM

## 2012-11-17 LAB — LIPID PANEL
Cholesterol: 197 mg/dL (ref 0–200)
HDL: 58 mg/dL (ref >39)
Total CHOL/HDL Ratio: 3.4 Ratio
VLDL: 17 mg/dL (ref 0–40)

## 2013-02-26 ENCOUNTER — Encounter: Payer: BC Managed Care – PPO | Admitting: Internal Medicine

## 2013-03-01 ENCOUNTER — Other Ambulatory Visit: Payer: Self-pay | Admitting: Internal Medicine

## 2013-03-13 ENCOUNTER — Other Ambulatory Visit: Payer: Self-pay | Admitting: Internal Medicine

## 2013-03-13 NOTE — Telephone Encounter (Signed)
Pt missed CPE appt recently. Please call her before refilling

## 2013-03-14 NOTE — Telephone Encounter (Signed)
Left message for patient to call us back re: appointment

## 2013-03-19 NOTE — Telephone Encounter (Signed)
Patient has not called us back.

## 2013-03-20 ENCOUNTER — Other Ambulatory Visit: Payer: Self-pay

## 2013-03-20 MED ORDER — CLONAZEPAM 0.5 MG PO TABS: 0.5000 mg | ORAL_TABLET | Freq: Every day | ORAL | Status: DC

## 2013-03-30 ENCOUNTER — Telehealth: Payer: Self-pay | Admitting: Internal Medicine

## 2013-03-30 NOTE — Telephone Encounter (Signed)
I am not going to be able to diagnose problem over the phone. The chest heaviness is bothersome. Is it because she cut back on Klonopin? The best bet is to be evaluated in the Emergency Dept since there is a chance this could be cardiac.

## 2013-03-30 NOTE — Telephone Encounter (Signed)
NOT having chest pain, no numbness.  She's emotional and teary talking to me on the phone.  She just wants to know if you think the inhaler would help her.  Patient does state that her husband wants to take her to a [facility] or Urgent Care but she wanted to call you PRIOR to him doing that.  She's worried about getting the "flu germs" if she goes to an urgent care type of setting.

## 2013-03-30 NOTE — Telephone Encounter (Signed)
Patient calls again, stating she is at an urgent care in [place], and is not able to be seen there. They recommend she go to an ED. I advised her to find some place where she can be evaluated. She is agreeable

## 2013-09-06 ENCOUNTER — Other Ambulatory Visit: Payer: Self-pay

## 2013-10-25 ENCOUNTER — Encounter: Payer: Self-pay | Admitting: Internal Medicine

## 2013-10-25 ENCOUNTER — Ambulatory Visit (INDEPENDENT_AMBULATORY_CARE_PROVIDER_SITE_OTHER): Payer: Medicare Other | Admitting: Internal Medicine

## 2013-10-25 VITALS — BP 162/100 | HR 60 | Temp 98.0°F | Ht 65.75 in | Wt 172.0 lb

## 2013-10-25 DIAGNOSIS — E669 Obesity, unspecified: Secondary | ICD-10-CM

## 2013-10-25 DIAGNOSIS — K219 Gastro-esophageal reflux disease without esophagitis: Secondary | ICD-10-CM | POA: Diagnosis not present

## 2013-10-25 DIAGNOSIS — J309 Allergic rhinitis, unspecified: Secondary | ICD-10-CM | POA: Diagnosis not present

## 2013-10-25 DIAGNOSIS — Z733 Stress, not elsewhere classified: Secondary | ICD-10-CM | POA: Diagnosis not present

## 2013-10-25 DIAGNOSIS — Z1329 Encounter for screening for other suspected endocrine disorder: Secondary | ICD-10-CM | POA: Diagnosis not present

## 2013-10-25 DIAGNOSIS — E785 Hyperlipidemia, unspecified: Secondary | ICD-10-CM

## 2013-10-25 DIAGNOSIS — Z13228 Encounter for screening for other metabolic disorders: Secondary | ICD-10-CM

## 2013-10-25 DIAGNOSIS — IMO0001 Reserved for inherently not codable concepts without codable children: Secondary | ICD-10-CM

## 2013-10-25 DIAGNOSIS — R030 Elevated blood-pressure reading, without diagnosis of hypertension: Secondary | ICD-10-CM

## 2013-10-25 DIAGNOSIS — Z130 Encounter for screening for diseases of the blood and blood-forming organs and certain disorders involving the immune mechanism: Secondary | ICD-10-CM | POA: Diagnosis not present

## 2013-10-25 DIAGNOSIS — Z0000 Encounter for general adult medical examination without abnormal findings: Secondary | ICD-10-CM

## 2013-10-25 DIAGNOSIS — F411 Generalized anxiety disorder: Secondary | ICD-10-CM

## 2013-10-25 DIAGNOSIS — F439 Reaction to severe stress, unspecified: Secondary | ICD-10-CM

## 2013-10-25 LAB — CBC WITH DIFFERENTIAL/PLATELET
BASOS ABS: 0 10*3/uL (ref 0.0–0.1)
Basophils Relative: 0 % (ref 0–1)
Eosinophils Absolute: 0.1 10*3/uL (ref 0.0–0.7)
Eosinophils Relative: 1 % (ref 0–5)
HEMATOCRIT: 39 % (ref 36.0–46.0)
Hemoglobin: 13.4 g/dL (ref 12.0–15.0)
LYMPHS PCT: 25 % (ref 12–46)
Lymphs Abs: 1.4 10*3/uL (ref 0.7–4.0)
MCH: 30.1 pg (ref 26.0–34.0)
MCHC: 34.4 g/dL (ref 30.0–36.0)
MCV: 87.6 fL (ref 78.0–100.0)
MONO ABS: 0.5 10*3/uL (ref 0.1–1.0)
Monocytes Relative: 9 % (ref 3–12)
Neutro Abs: 3.6 10*3/uL (ref 1.7–7.7)
Neutrophils Relative %: 65 % (ref 43–77)
PLATELETS: 233 10*3/uL (ref 150–400)
RBC: 4.45 MIL/uL (ref 3.87–5.11)
RDW: 14.6 % (ref 11.5–15.5)
WBC: 5.5 10*3/uL (ref 4.0–10.5)

## 2013-10-25 LAB — LIPID PANEL
Cholesterol: 205 mg/dL — ABNORMAL HIGH (ref 0–200)
HDL: 64 mg/dL (ref >39)
LDL CALC: 125 mg/dL — AB (ref 0–99)
Total CHOL/HDL Ratio: 3.2 Ratio
Triglycerides: 78 mg/dL (ref <150)
VLDL: 16 mg/dL (ref 0–40)

## 2013-10-25 LAB — COMPREHENSIVE METABOLIC PANEL
ALBUMIN: 4.8 g/dL (ref 3.5–5.2)
ALK PHOS: 63 U/L (ref 39–117)
ALT: 22 U/L (ref 0–35)
AST: 23 U/L (ref 0–37)
BUN: 9 mg/dL (ref 6–23)
CALCIUM: 9.4 mg/dL (ref 8.4–10.5)
CHLORIDE: 105 meq/L (ref 96–112)
CO2: 24 mEq/L (ref 19–32)
Creat: 0.55 mg/dL (ref 0.50–1.10)
Glucose, Bld: 106 mg/dL — ABNORMAL HIGH (ref 70–99)
POTASSIUM: 3.9 meq/L (ref 3.5–5.3)
Sodium: 142 mEq/L (ref 135–145)
Total Bilirubin: 1.3 mg/dL — ABNORMAL HIGH (ref 0.2–1.2)
Total Protein: 7 g/dL (ref 6.0–8.3)

## 2013-10-25 LAB — POCT URINALYSIS DIPSTICK
Bilirubin, UA: NEGATIVE
Blood, UA: NEGATIVE
Glucose, UA: NEGATIVE
KETONES UA: NEGATIVE
LEUKOCYTES UA: NEGATIVE
Nitrite, UA: NEGATIVE
Protein, UA: NEGATIVE
Spec Grav, UA: <=1.005
Urobilinogen, UA: NEGATIVE
pH, UA: 6

## 2013-10-25 LAB — TSH: TSH: 2.959 u[IU]/mL (ref 0.350–4.500)

## 2013-10-25 MED ORDER — CLONAZEPAM 0.5 MG PO TABS: 0.5000 mg | ORAL_TABLET | Freq: Two times a day (BID) | ORAL | Status: DC | PRN

## 2013-10-25 NOTE — Patient Instructions (Signed)
Continue Klonopin for anxiety. Take blood pressure at home and call us with readings in 2 weeks. Have mammogram.
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
## 2013-10-26 LAB — VITAMIN D 25 HYDROXY (VIT D DEFICIENCY, FRACTURES): VIT D 25 HYDROXY: 24 ng/mL — AB (ref 30–89)

## 2013-11-09 ENCOUNTER — Other Ambulatory Visit: Payer: Self-pay | Admitting: Internal Medicine

## 2013-11-09 DIAGNOSIS — Z1231 Encounter for screening mammogram for malignant neoplasm of breast: Secondary | ICD-10-CM

## 2013-12-03 ENCOUNTER — Telehealth: Payer: Self-pay | Admitting: Internal Medicine

## 2013-12-03 NOTE — Telephone Encounter (Signed)
Please call pt and tell her she will be receiving a personal note from me and an official letter from [facility]. She will have 30 days of emergency care and should use that time to find another primary acre provider closer to home.

## 2013-12-03 NOTE — Telephone Encounter (Signed)
Pt called wanted to schedule an appointment, I explained that she was mailed a letter today and she would need to find  New PCP.  Recommended that she call [employer], gave her number on the back of her card and get an in-network provider in the [place], [place] area.  She expressed understanding. / lt

## 2013-12-04 NOTE — Telephone Encounter (Signed)
Thanks

## 2013-12-04 NOTE — Telephone Encounter (Signed)
Called and left voicemail for pt explaining the information below.  Left my direct phone number for her to call back with any questions.

## 2013-12-10 ENCOUNTER — Telehealth: Payer: Self-pay | Admitting: Internal Medicine

## 2013-12-10 NOTE — Telephone Encounter (Signed)
Pt called Dr. Beryle Quant office asking for the letter her husband sent to Dr. Lenord Fellers.  I called pt to address her request and to be sure she had gotten the letter from Dr. Lenord Fellers explaining that she needed to find another doctor.  She stated yes she knew she needed to get a new doctor and she has already found one.  She asked that I mail her a medical record release so she could request the letter her husband sent to Dr. Lenord Fellers.  I told her I would mail it out today.

## 2014-01-09 ENCOUNTER — Telehealth: Payer: Self-pay | Admitting: Internal Medicine

## 2014-01-09 NOTE — Telephone Encounter (Signed)
Per phone notes patient dismissed from the practice of [facility]. [facility] did not process dismissal. Received outside letter and blank signed dismissal form, unaware of contents of letter sent to patient, documents scanned, rmf.

## 2014-01-14 ENCOUNTER — Encounter: Payer: Self-pay | Admitting: Internal Medicine

## 2014-04-21 ENCOUNTER — Other Ambulatory Visit: Payer: Self-pay | Admitting: Internal Medicine

## 2014-05-15 ENCOUNTER — Telehealth: Payer: Self-pay | Admitting: Internal Medicine

## 2014-05-15 NOTE — Telephone Encounter (Signed)
Patient calls wanting advice on stopping Klonopin.  Wants to know if she should stop all at once or if she could just start taking 1 daily.  She stopped and then found some pills and wants to take them just once daily.  I verified DOB and advised patient that she is no longer a patient of this practice.  Advised due to her no longer being seen here, I am unable to provide her with any advice or assist her in any way.  Advised patient that she should contact the provider that she has been seeing.  Patient states she seen someone and didn't like him.  I apologized to the patient but again told her that I am unable to assist her due to her no longer being a patient of this practice.

## 2014-10-29 ENCOUNTER — Encounter: Payer: Medicare Other | Admitting: Internal Medicine

## 2017-02-10 ENCOUNTER — Encounter: Payer: Self-pay | Admitting: *Deleted

## 2017-02-14 ENCOUNTER — Encounter: Payer: Self-pay | Admitting: Diagnostic Neuroimaging

## 2017-02-14 ENCOUNTER — Ambulatory Visit: Payer: Medicare Other | Admitting: Diagnostic Neuroimaging

## 2017-02-14 VITALS — BP 139/81 | HR 91 | Ht 66.5 in

## 2017-02-14 DIAGNOSIS — F0391 Unspecified dementia with behavioral disturbance: Secondary | ICD-10-CM

## 2017-02-14 DIAGNOSIS — F03B18 Unspecified dementia, moderate, with other behavioral disturbance: Secondary | ICD-10-CM

## 2017-02-14 MED ORDER — DONEPEZIL HCL 10 MG PO TABS: 10.0000 mg | ORAL_TABLET | Freq: Every day | ORAL | 12 refills | Status: DC

## 2017-02-14 MED ORDER — MEMANTINE HCL 10 MG PO TABS: 10.0000 mg | ORAL_TABLET | Freq: Two times a day (BID) | ORAL | 12 refills | Status: DC

## 2017-02-14 NOTE — Patient Instructions (Addendum)
Thank you for coming to see [place] at [facility]. I hope we have been able to provide you high quality care today.  You may receive a patient satisfaction survey over the next few weeks. We would appreciate your feedback and comments so that we may continue to improve ourselves and the health of our patients.  - start memantine 28m at bedtime; after 1 week increase to 197mtwice a day  - after 2 weeks, start donepezil 1069mt bedtime  - visit www.[facility].clinic for more information on research studies   ~~~~~~~~~~~~~~~~~~~~~~~~~~~~~~~~~~~~~~~~~~~~~~~~~~~~~~~~~~~~~~~~~  DR. PENUMALLI'S GUIDE TO HAPPY AND HEALTHY LIVING These are some of my general health and wellness recommendations. Some of them may apply to you better than others. Please use common sense as you try these suggestions and feel free to ask me any questions.   ACTIVITY/FITNESS Mental, social, emotional and physical stimulation are very important for brain and body health. Try learning a new activity (arts, music, language, sports, games).  Keep moving your body to the best of your abilities. You can do this at home, inside or outside, the park, community center, gym or anywhere you like. Consider a physical therapist or personal trainer to get started. Consider the app Sworkit. Fitness trackers such as smart-watches, smart-phones or Fitbits can help as well.   NUTRITION Eat more plants: colorful vegetables, nuts, seeds and berries.  Eat less sugar, salt, preservatives and processed foods.  Avoid toxins such as cigarettes and alcohol.  Drink water when you are thirsty. Warm water with a slice of lemon is an excellent morning drink to start the day.  Consider these websites for more information The Nutrition Source (htthttps://www.henry-hernandez.biz/recision Nutrition (wwwWindowBlog.ch RELAXATION Consider practicing mindfulness meditation or other relaxation  techniques such as deep breathing, prayer, yoga, tai chi, massage. See website mindful.org or the apps Headspace or Calm to help get started.   SLEEP Try to get at least 7-8+ hours sleep per day. Regular exercise and reduced caffeine will help you sleep better. Practice good sleep hygeine techniques. See website sleep.org for more information.   PLANNING Prepare estate planning, living will, healthcare POA documents. Sometimes this is best planned with the help of an attorney. Theconversationproject.org and agingwithdignity.org are excellent resources.

## 2017-02-14 NOTE — Progress Notes (Signed)
[facility]  PATIENT: Darlene Dorsey DOB: Jul 25, 1950  REFERRING CLINICIAN: Guarino HISTORY FROM: patient, husband, chart review REASON FOR VISIT: new consult   HISTORICAL  CHIEF COMPLAINT:  Chief Complaint  Patient presents with  . Dementia    rm 7, New Pt, husband- Legrand Como    HISTORY OF PRESENT ILLNESS:   66 year old right-handed female here for evaluation of dementia.  Patient does not sure why she is here for this evaluation.  She thinks this is a place to help her "learn better".  She denies any significant memory problems.  She gets defensive.  She feels that her neighbors and friends have similar memory problems.  She is tangential in her thought processes.  When redirected and reminded about her diagnosis of dementia patient states that she "does not like" her other doctors.  According to patient's husband, patient has had gradual onset progressive short-term memory problems and confusion for a few years.  Patient was diagnosed with dementia in 2017.  She is tried donepezil and combination donepezil-memantine a few months ago.  She stopped these medications 2 months ago, as she was running out of samples and planning to come here for this evaluation.    Patient's husband is interested and clinical research studies for patient.  He is interested the [employer] in clinical research study specifically.  Patient and husband live about 2 hours away, but have family in [place], and are willing and able to travel for research study.  Patient has had to reduce her day-to-day activities.  She is no longer able to keep up with her medications or appointments.  She no longer drives.  She has limited and restricted interest.  She is able to maintain her personal hygiene, bathing, dressing, eating.  Patient currently on Eliquis for atrial fibrillation, but has appointment with cardiology and may come off this medication soon.   REVIEW OF SYSTEMS: Full 14  system review of systems performed and negative with exception of: Fatigue memory loss hallucinations disinterest in activities.   ALLERGIES: Allergies  Allergen Reactions  . Motrin [Ibuprofen] Other (See Comments)    GI symptoms    HOME MEDICATIONS: Outpatient Medications Prior to Visit  Medication Sig Dispense Refill  . albuterol (VENTOLIN HFA) 108 (90 Base) MCG/ACT inhaler Inhale into the lungs.    Marland Kitchen amLODipine (NORVASC) 2.5 MG tablet amlodipine 2.5 mg tablet  Take 1 tablet every day by oral route.    Marland Kitchen apixaban (ELIQUIS) 5 MG TABS tablet Eliquis 5 mg tablet  Take 1 tablet twice a day by oral route.    . Cetirizine HCl 10 MG CAPS cetirizine 10 mg tablet  Take 1 tablet every day by oral route as needed.    . clonazePAM (KLONOPIN) 0.5 MG tablet Take 1 tablet (0.5 mg total) by mouth 2 (two) times daily as needed for anxiety. 60 tablet 5  . cyclobenzaprine (FLEXERIL) 10 MG tablet cyclobenzaprine 10 mg tablet  Take 1 tablet every day by oral route at bedtime for 30 days.    . fluticasone (FLONASE) 50 MCG/ACT nasal spray Use two sprays in each nostril daily 16 g PRN  . losartan-hydrochlorothiazide (HYZAAR) 100-25 MG tablet losartan 100 mg-hydrochlorothiazide 25 mg tablet    . metoprolol tartrate (LOPRESSOR) 25 MG tablet metoprolol tartrate 25 mg tablet  Take 1 tablet twice a day by oral route as needed.    . pantoprazole (PROTONIX) 40 MG tablet     . Polyethyl Glycol-Propyl Glycol (SYSTANE ULTRA OP) Systane Ultra 0.4 %-  0.3 % eye drops  as needed    . vitamin B-12 (CYANOCOBALAMIN) 1000 MCG tablet Vitamin B-12 1,000 mcg tablet  Take 1 tablet every day by oral route.    Marland Kitchen aspirin 81 MG chewable tablet Chew 81 mg by mouth daily.    Marland Kitchen donepezil (ARICEPT) 5 MG tablet 5 mg. 02/14/17 Stopped 3 mos ago    . Memantine HCl-Donepezil HCl (NAMZARIC) 28-10 MG CP24 Namzaric 09/26/19/28 mg-10 mg capsule,sprinkle,ER 24hr,dose pack  as directed  lot # [id number] exp 01/2017     No facility-administered  medications prior to visit.     PAST MEDICAL HISTORY: Past Medical History:  Diagnosis Date  . Allergy   . Anxiety   . Dementia   . GERD (gastroesophageal reflux disease)   . Hypertension   . Insomnia   . Low back pain     PAST SURGICAL HISTORY: Past Surgical History:  Procedure Laterality Date  . CATARACT EXTRACTION, BILATERAL    . CHOLECYSTECTOMY  2008    FAMILY HISTORY: Family History  Problem Relation Age of Onset  . Hypertension Mother   . Heart disease Father   . Alzheimer's disease Father   . Diabetes Sister   . Hypertension Sister   . Diabetes Brother     SOCIAL HISTORY:  Social History   Socioeconomic History  . Marital status: Married    Spouse name: Not on file  . Number of children: 2  . Years of education: Not on file  . Highest education level: Not on file  Social Needs  . Financial resource strain: Not on file  . Food insecurity - worry: Not on file  . Food insecurity - inability: Not on file  . Transportation needs - medical: Not on file  . Transportation needs - non-medical: Not on file  Occupational History  . Not on file  Tobacco Use  . Smoking status: Never Smoker  . Smokeless tobacco: Never Used  Substance and Sexual Activity  . Alcohol use: Yes    Comment: 02/14/17 couple beers/weekly  . Drug use: No  . Sexual activity: Not on file  Other Topics Concern  . Not on file  Social History Narrative   Lives with husband, has 2 children   Caffeine, 1 cup coffee daily   High school, retired     PHYSICAL EXAM  GENERAL EXAM/CONSTITUTIONAL: Vitals:  Vitals:   02/14/17 1247  BP: 139/81  Pulse: 91  Height: 5' 6.5" (1.689 m)     Body mass index is 27.35 kg/m.  Visual Acuity Screening   Right eye Left eye Both eyes  Without correction: 20/50 20/40   With correction:        Patient is in no distress; well developed, nourished and groomed; neck is supple  CARDIOVASCULAR:  Examination of carotid arteries is normal; no  carotid bruits  Regular rate and rhythm, no murmurs  Examination of peripheral vascular system by observation and palpation is normal  EYES:  Ophthalmoscopic exam of optic discs and posterior segments is normal; no papilledema or hemorrhages  MUSCULOSKELETAL:  Gait, strength, tone, movements noted in Neurologic exam below  NEUROLOGIC: MENTAL STATUS:  MMSE - [place] Exam 02/14/2017  Orientation to time 1  Orientation to Place 3  Registration 3  Attention/ Calculation 0  Recall 2  Language- name 2 objects 2  Language- repeat 1  Language- follow 3 step command 3  Language- read & follow direction 1  Write a sentence 1  Copy design 0  Total score 17    awake, alert, oriented to person; NOT PLACE OR TIME  DECR MEMORY  DECR attention and concentration  language fluent, comprehension intact, naming intact,   fund of knowledge appropriate  TANGENTIAL  CRANIAL NERVE:    2nd - no papilledema on fundoscopic exam  2nd, 3rd, 4th, 6th - pupils equal and reactive to light, visual fields full to confrontation, extraocular muscles intact, no nystagmus  5th - facial sensation symmetric  7th - facial strength symmetric  8th - hearing intact  9th - palate elevates symmetrically, uvula midline  11th - shoulder shrug symmetric  12th - tongue protrusion midline  MOTOR:   normal bulk and tone, full strength in the BUE, BLE  SENSORY:   normal and symmetric to light touch, temperature, vibration  COORDINATION:   finger-nose-finger, fine finger movements normal  REFLEXES:   deep tendon reflexes present and symmetric  GAIT/STATION:   narrow based gait    DIAGNOSTIC DATA (LABS, IMAGING, TESTING) - I reviewed patient records, labs, notes, testing and imaging myself where available.  Lab Results  Component Value Date   WBC 5.5 10/25/2013   HGB 13.4 10/25/2013   HCT 39.0 10/25/2013   MCV 87.6 10/25/2013   PLT 233 10/25/2013      Component Value  Date/Time   NA 142 10/25/2013 1006   K 3.9 10/25/2013 1006   CL 105 10/25/2013 1006   CO2 24 10/25/2013 1006   GLUCOSE 106 (H) 10/25/2013 1006   BUN 9 10/25/2013 1006   CREATININE 0.55 10/25/2013 1006   CALCIUM 9.4 10/25/2013 1006   PROT 7.0 10/25/2013 1006   ALBUMIN 4.8 10/25/2013 1006   AST 23 10/25/2013 1006   ALT 22 10/25/2013 1006   ALKPHOS 63 10/25/2013 1006   BILITOT 1.3 (H) 10/25/2013 1006   GFRNONAA >60 11/15/2008 1537   GFRAA  11/15/2008 1537    >60        The eGFR has been calculated using the MDRD equation. This calculation has not been validated in all clinical situations. eGFR's persistently <60 mL/min signify possible Chronic Kidney Disease.   Lab Results  Component Value Date   CHOL 205 (H) 10/25/2013   HDL 64 10/25/2013   LDLCALC 125 (H) 10/25/2013   TRIG 78 10/25/2013   CHOLHDL 3.2 10/25/2013   No results found for: HGBA1C No results found for: VITAMINB12 Lab Results  Component Value Date   TSH 2.959 10/25/2013    10/24/15 MRI brain [I reviewed images myself and agree with interpretation. -VRP]  - moderate chronic small vessel ischemic disease - no acute findings    ASSESSMENT AND PLAN  66 y.o. year old female here with gradual onset progressive short-term memory loss, confusion, poor insight, loss of ADLs, MMSE 17 out of 30, most consistent with neurodegenerative dementia.     Dx:   1. Moderate dementia with behavioral disturbance      PLAN:  - I discussed diagnosis, prognosis, treatment options.  Also reviewed importance of safety and supervision and advanced care planning. - Start memantine 32m at bedtime; after 1 week increase to 176mtwice a day; after 2 weeks, start donepezil 1084mt bedtime - Reviewed clinical research study options; forwarded information to research coordinator ([employer] or GRADUATE studies)  Meds ordered this encounter  Medications  . memantine (NAMENDA) 10 MG tablet    Sig: Take 1 tablet (10 mg total)  by mouth 2 (two) times daily.    Dispense:  60 tablet  Refill:  12  . donepezil (ARICEPT) 10 MG tablet    Sig: Take 1 tablet (10 mg total) by mouth at bedtime.    Dispense:  30 tablet    Refill:  12   Return in about 6 months (around 08/15/2017).    Penni Bombard, MD 40/05/4740, 5:95 PM Certified in Neurology, Neurophysiology and Neuroimaging  [facility] [address] [place], [place] [address] [phone]

## 2017-03-09 ENCOUNTER — Other Ambulatory Visit: Payer: Self-pay

## 2017-03-09 MED ORDER — MEMANTINE HCL 10 MG PO TABS: 10.0000 mg | ORAL_TABLET | Freq: Two times a day (BID) | ORAL | 3 refills | Status: DC

## 2017-08-22 ENCOUNTER — Encounter: Payer: Self-pay | Admitting: Diagnostic Neuroimaging

## 2017-08-22 ENCOUNTER — Ambulatory Visit: Payer: Medicare Other | Admitting: Diagnostic Neuroimaging

## 2017-08-22 VITALS — BP 120/75 | HR 62 | Ht 66.5 in | Wt 180.0 lb

## 2017-08-22 DIAGNOSIS — F0391 Unspecified dementia with behavioral disturbance: Secondary | ICD-10-CM

## 2017-08-22 DIAGNOSIS — F03B18 Unspecified dementia, moderate, with other behavioral disturbance: Secondary | ICD-10-CM

## 2017-08-22 MED ORDER — DONEPEZIL HCL 10 MG PO TABS: 10.0000 mg | ORAL_TABLET | Freq: Every day | ORAL | 4 refills | Status: DC

## 2017-08-22 MED ORDER — MEMANTINE HCL 10 MG PO TABS: 10.0000 mg | ORAL_TABLET | Freq: Two times a day (BID) | ORAL | 4 refills | Status: DC

## 2017-08-22 NOTE — Progress Notes (Signed)
[facility]  PATIENT: Darlene Dorsey DOB: 03/24/1950  REFERRING CLINICIAN: Guarino HISTORY FROM: patient, husband, chart review REASON FOR VISIT: follow up   HISTORICAL  CHIEF COMPLAINT:  Chief Complaint  Patient presents with  . Dementia    rm 6, husband- Legrand Como, patient refused to take MMSE  . Follow-up    6 month    HISTORY OF PRESENT ILLNESS:   UPDATE (08/22/17, VRP): Since last visit, doing well. Tolerating meds. No alleviating or aggravating factors. Memory loss stable. Mood stable. She declined memory testing today.  PRIOR HPI (02/14/17): 67 year old right-handed female here for evaluation of dementia.  Patient does not sure why she is here for this evaluation.  She thinks this is a place to help her "learn better".  She denies any significant memory problems.  She gets defensive.  She feels that her neighbors and friends have similar memory problems.  She is tangential in her thought processes.  When redirected and reminded about her diagnosis of dementia patient states that she "does not like" her other doctors.  According to patient's husband, patient has had gradual onset progressive short-term memory problems and confusion for a few years.  Patient was diagnosed with dementia in 2017.  She is tried donepezil and combination donepezil-memantine a few months ago.  She stopped these medications 2 months ago, as she was running out of samples and planning to come here for this evaluation.    Patient's husband is interested and clinical research studies for patient.  He is interested the [employer] in clinical research study specifically.  Patient and husband live about 2 hours away, but have family in [place], and are willing and able to travel for research study.  Patient has had to reduce her day-to-day activities.  She is no longer able to keep up with her medications or appointments.  She no longer drives.  She has limited and restricted  interest.  She is able to maintain her personal hygiene, bathing, dressing, eating.  Patient currently on Eliquis for atrial fibrillation, but has appointment with cardiology and may come off this medication soon.   REVIEW OF SYSTEMS: Full 14 system review of systems performed and negative with exception of: memory loss cough neck pain runny nose eye discharge.    ALLERGIES: Allergies  Allergen Reactions  . Motrin [Ibuprofen] Other (See Comments)    GI symptoms    HOME MEDICATIONS: Outpatient Medications Prior to Visit  Medication Sig Dispense Refill  . acetaminophen (TYLENOL) 500 MG tablet Tylenol Extra Strength 500 mg tablet  Take 2 tablets every 4 hours by oral route as needed.    Marland Kitchen albuterol (VENTOLIN HFA) 108 (90 Base) MCG/ACT inhaler Inhale into the lungs.    Marland Kitchen apixaban (ELIQUIS) 5 MG TABS tablet Eliquis 5 mg tablet  Take 1 tablet twice a day by oral route.    . Cetirizine HCl 10 MG CAPS cetirizine 10 mg tablet  Take 1 tablet every day by oral route as needed.    . cyclobenzaprine (FLEXERIL) 10 MG tablet cyclobenzaprine 10 mg tablet  Take 1 tablet every day by oral route at bedtime for 30 days.    Marland Kitchen donepezil (ARICEPT) 10 MG tablet Take 1 tablet (10 mg total) by mouth at bedtime. 30 tablet 12  . lisinopril-hydrochlorothiazide (PRINZIDE,ZESTORETIC) 20-12.5 MG tablet daily. Two tabs daily    . memantine (NAMENDA) 10 MG tablet Take 1 tablet (10 mg total) by mouth 2 (two) times daily. 180 tablet 3  . Memantine HCl-Donepezil  HCl (NAMZARIC) 28-10 MG CP24 Namzaric 09/26/19/28 mg-10 mg capsule,sprinkle,ER 24hr,dose pack  as directed  lot # [id number] exp 01/2017    . metoprolol tartrate (LOPRESSOR) 25 MG tablet metoprolol tartrate 25 mg tablet  Take 1 tablet twice a day by oral route as needed.    Vladimir Faster Glycol-Propyl Glycol (SYSTANE ULTRA OP) Systane Ultra 0.4 %-0.3 % eye drops  as needed    . vitamin B-12 (CYANOCOBALAMIN) 1000 MCG tablet Vitamin B-12 1,000 mcg tablet  Take 1  tablet every day by oral route.    . fluticasone (FLONASE) 50 MCG/ACT nasal spray Use two sprays in each nostril daily (Patient not taking: Reported on 08/22/2017) 16 g PRN  . pantoprazole (PROTONIX) 40 MG tablet     . amLODipine (NORVASC) 2.5 MG tablet amlodipine 2.5 mg tablet  Take 1 tablet every day by oral route.    Marland Kitchen aspirin 81 MG chewable tablet Chew 81 mg by mouth daily.    . clonazePAM (KLONOPIN) 0.5 MG tablet Take 1 tablet (0.5 mg total) by mouth 2 (two) times daily as needed for anxiety. 60 tablet 5  . losartan-hydrochlorothiazide (HYZAAR) 100-25 MG tablet losartan 100 mg-hydrochlorothiazide 25 mg tablet     No facility-administered medications prior to visit.     PAST MEDICAL HISTORY: Past Medical History:  Diagnosis Date  . Allergy   . Anxiety   . Dementia   . GERD (gastroesophageal reflux disease)   . Hypertension   . Insomnia   . Low back pain     PAST SURGICAL HISTORY: Past Surgical History:  Procedure Laterality Date  . CATARACT EXTRACTION, BILATERAL    . CHOLECYSTECTOMY  2008    FAMILY HISTORY: Family History  Problem Relation Age of Onset  . Hypertension Mother   . Heart disease Father   . Alzheimer's disease Father   . Diabetes Sister   . Hypertension Sister   . Diabetes Brother     SOCIAL HISTORY:  Social History   Socioeconomic History  . Marital status: Married    Spouse name: Not on file  . Number of children: 2  . Years of education: Not on file  . Highest education level: Not on file  Occupational History  . Not on file  Social Needs  . Financial resource strain: Not on file  . Food insecurity:    Worry: Not on file    Inability: Not on file  . Transportation needs:    Medical: Not on file    Non-medical: Not on file  Tobacco Use  . Smoking status: Never Smoker  . Smokeless tobacco: Never Used  Substance and Sexual Activity  . Alcohol use: Yes    Comment: 02/14/17 couple beers/weekly  . Drug use: No  . Sexual activity: Not on  file  Lifestyle  . Physical activity:    Days per week: Not on file    Minutes per session: Not on file  . Stress: Not on file  Relationships  . Social connections:    Talks on phone: Not on file    Gets together: Not on file    Attends religious service: Not on file    Active member of club or organization: Not on file    Attends meetings of clubs or organizations: Not on file    Relationship status: Not on file  . Intimate partner violence:    Fear of current or ex partner: Not on file    Emotionally abused: Not on file  Physically abused: Not on file    Forced sexual activity: Not on file  Other Topics Concern  . Not on file  Social History Narrative   Lives with husband, has 2 children   Caffeine, 1 cup coffee daily   High school, retired     PHYSICAL EXAM  GENERAL EXAM/CONSTITUTIONAL: Vitals:  Vitals:   08/22/17 1340  BP: 120/75  Pulse: 62  Weight: 180 lb (81.6 kg)  Height: 5' 6.5" (1.689 m)   Body mass index is 28.62 kg/m. No exam data present  Patient is in no distress; well developed, nourished and groomed; neck is supple  CARDIOVASCULAR:  Examination of carotid arteries is normal; no carotid bruits  Regular rate and rhythm, no murmurs  Examination of peripheral vascular system by observation and palpation is normal  EYES:  Ophthalmoscopic exam of optic discs and posterior segments is normal; no papilledema or hemorrhages  MUSCULOSKELETAL:  Gait, strength, tone, movements noted in Neurologic exam below  NEUROLOGIC: MENTAL STATUS:  MMSE - Lares Exam 08/22/2017 02/14/2017  Not completed: Refused -  Orientation to time - 1  Orientation to Place - 3  Registration - 3  Attention/ Calculation - 0  Recall - 2  Language- name 2 objects - 2  Language- repeat - 1  Language- follow 3 step command - 3  Language- read & follow direction - 1  Write a sentence - 1  Copy design - 0  Total score - 17    awake, alert, oriented to  person; NOT PLACE OR TIME  DECR MEMORY  DECR attention and concentration  language fluent, comprehension intact, naming intact,   fund of knowledge appropriate  CALM, PLEASANT  CRANIAL NERVE:    2nd - no papilledema on fundoscopic exam  2nd, 3rd, 4th, 6th - pupils equal and reactive to light, visual fields full to confrontation, extraocular muscles intact, no nystagmus  5th - facial sensation symmetric  7th - facial strength symmetric  8th - hearing intact  9th - palate elevates symmetrically, uvula midline  11th - shoulder shrug symmetric  12th - tongue protrusion midline  MOTOR:   normal bulk and tone, full strength in the BUE, BLE  SENSORY:   normal and symmetric to light touch, temperature, vibration  COORDINATION:   finger-nose-finger, fine finger movements normal  REFLEXES:   deep tendon reflexes present and symmetric  GAIT/STATION:   narrow based gait    DIAGNOSTIC DATA (LABS, IMAGING, TESTING) - I reviewed patient records, labs, notes, testing and imaging myself where available.  Lab Results  Component Value Date   WBC 5.5 10/25/2013   HGB 13.4 10/25/2013   HCT 39.0 10/25/2013   MCV 87.6 10/25/2013   PLT 233 10/25/2013      Component Value Date/Time   NA 142 10/25/2013 1006   K 3.9 10/25/2013 1006   CL 105 10/25/2013 1006   CO2 24 10/25/2013 1006   GLUCOSE 106 (H) 10/25/2013 1006   BUN 9 10/25/2013 1006   CREATININE 0.55 10/25/2013 1006   CALCIUM 9.4 10/25/2013 1006   PROT 7.0 10/25/2013 1006   ALBUMIN 4.8 10/25/2013 1006   AST 23 10/25/2013 1006   ALT 22 10/25/2013 1006   ALKPHOS 63 10/25/2013 1006   BILITOT 1.3 (H) 10/25/2013 1006   GFRNONAA >60 11/15/2008 1537   GFRAA  11/15/2008 1537    >60        The eGFR has been calculated using the MDRD equation. This calculation has not been validated  in all clinical situations. eGFR's persistently <60 mL/min signify possible Chronic Kidney Disease.   Lab Results  Component  Value Date   CHOL 205 (H) 10/25/2013   HDL 64 10/25/2013   LDLCALC 125 (H) 10/25/2013   TRIG 78 10/25/2013   CHOLHDL 3.2 10/25/2013   No results found for: HGBA1C No results found for: VITAMINB12 Lab Results  Component Value Date   TSH 2.959 10/25/2013    10/24/15 MRI brain [I reviewed images myself and agree with interpretation. -VRP]  - moderate chronic small vessel ischemic disease - no acute findings    ASSESSMENT AND PLAN  67 y.o. year old female here with gradual onset progressive short-term memory loss, confusion, poor insight, loss of ADLs, MMSE 17 out of 30 (2018), most consistent with neurodegenerative dementia.    Ron Parker ADL 6/6  Valentino Nose IADL 3/8   Dx:   1. Moderate dementia with behavioral disturbance      PLAN:  - continue memantine 50m twice a day + donepezil 151mdaily   - I discussed diagnosis, prognosis, treatment options.  Also reviewed importance of safety and supervision and advanced care planning  - Reviewed clinical research study options; forwarded information to research coordinator --> not candidate at this time due to anticoagulation  Meds ordered this encounter  Medications  . memantine (NAMENDA) 10 MG tablet    Sig: Take 1 tablet (10 mg total) by mouth 2 (two) times daily.    Dispense:  180 tablet    Refill:  4  . donepezil (ARICEPT) 10 MG tablet    Sig: Take 1 tablet (10 mg total) by mouth at bedtime.    Dispense:  90 tablet    Refill:  4   Return if symptoms worsen or fail to improve, for return to PCP.    VIPenni BombardMD 08/14/83/90932:1:12M Certified in Neurology, Neurophysiology and Neuroimaging  [facility] [address] [id number]

## 2018-05-12 ENCOUNTER — Telehealth: Payer: Self-pay | Admitting: Diagnostic Neuroimaging

## 2018-05-12 NOTE — Telephone Encounter (Signed)
Spoke to husband of pt.  He feels like pt is stable on medications.   (still not driving, not doing financial decisions, is constantly losing things).  Is having hallucinations, some paranoia (seeing women, or thinking they have been in home, thinks husband is seeing other women).  Made appt for evaluation 05-24-18 at 1300.  Will try to do MMSE is pt cooperative.  Asked pcp about med for anxiety and they were not comfortable giving due to aricept and namenda.

## 2018-05-12 NOTE — Telephone Encounter (Signed)
Pt husband(on DPR) is asking for a call from RN to discuss   donepezil (ARICEPT) 10 MG tablet  memantine (NAMENDA) 10 MG tablet

## 2018-05-24 ENCOUNTER — Ambulatory Visit: Payer: Self-pay | Admitting: Diagnostic Neuroimaging

## 2018-07-13 ENCOUNTER — Telehealth: Payer: Self-pay | Admitting: *Deleted

## 2018-07-13 NOTE — Telephone Encounter (Signed)
LVM requesting call back to move appointment sooner and discuss video visit. Advised due to current COVID 19 pandemic, our office is severely reducing in person visits in order to minimize the risk to our patients and healthcare providers. Left office number.

## 2018-08-17 ENCOUNTER — Telehealth: Payer: Self-pay | Admitting: *Deleted

## 2018-08-17 NOTE — Telephone Encounter (Signed)
Received call back and advised husband that Dr Merceda Elks stated he may schedule video visit or continue to follow up with her PCP. Darlene Dorsey stated the PCP recommended he discuss medications with Dr Marjory Lies. So he requested video visit. He understands we'll take all precautions to reduce any security or privacy concerns. This will be treated like an office visit, and we will file with her insurance.  He consented to video visit.  E mail: [email or web address] We scheduled for next Mon., updated EMR. He  verbalized understanding, appreciation. E mail sent.

## 2018-08-17 NOTE — Telephone Encounter (Signed)
LVM requesting call back.

## 2018-08-17 NOTE — Telephone Encounter (Signed)
Pt husband has returned the call to [employer]. He is asking for a call back

## 2018-08-17 NOTE — Telephone Encounter (Signed)
Received my chart: This is Darlene Dorsey, spouse w/power of attorney asking regarding [name] 1950-04-16. Darlene Dorsey has been taking Memantine HCL 10MG  and Donepezil HCL 10MG  for appx 3-4 years. Her memory seems to have not worsened significantly during that time. She continues to not drive, gets disoriented in crowds or unfamiliar places, can't work with numbers. But she does remember our address, her birth date, our home phone #, her SS#, a dinner time prayer she has known for years, etc. The biggest issue is her fixation with a female named "Darlene Dorsey" who steals her things, comes in our house, sometimes is in a closet, is the reason for misplaced personal items, for clothing that may be soiled, etc. This causes a lot of stress. She sees people in public she thinks is Darlene Dorsey, points to them and makes them feel uncomfortable. I wonder if this is due to "hallucinations" the 2 drugs may cause. I do not want to discontinue them. Are other drugs available that do not have the hallucination side effects? I replied and offered video visit. He agreed; I called and LVM requesting call back to schedule.

## 2018-08-17 NOTE — Addendum Note (Signed)
Addended by: [place] Pink on: 08/17/2018 04:21 PM   Modules accepted: Orders

## 2018-08-21 ENCOUNTER — Ambulatory Visit (INDEPENDENT_AMBULATORY_CARE_PROVIDER_SITE_OTHER): Payer: Medicare Other | Admitting: Diagnostic Neuroimaging

## 2018-08-21 ENCOUNTER — Other Ambulatory Visit: Payer: Self-pay

## 2018-08-21 ENCOUNTER — Encounter: Payer: Self-pay | Admitting: Diagnostic Neuroimaging

## 2018-08-21 DIAGNOSIS — F03B18 Unspecified dementia, moderate, with other behavioral disturbance: Secondary | ICD-10-CM

## 2018-08-21 DIAGNOSIS — F0391 Unspecified dementia with behavioral disturbance: Secondary | ICD-10-CM | POA: Diagnosis not present

## 2018-08-21 NOTE — Progress Notes (Signed)
    Virtual Visit via Video Note  I connected with Darlene Dorsey on 08/21/18 at  4:30 PM EDT by a video enabled telemedicine application and verified that I am speaking with the correct person using two identifiers.   I discussed the limitations of evaluation and management by telemedicine and the availability of in person appointments. The patient expressed understanding and agreed to proceed.  Patient is at their home. I am at the office. I spoke with patient and her husband.    History of Present Illness:  - since last visit, memory loss stable - continues with paranoia, confusion, hiding things - family and PCP requesting to come off donepezil and memantine    Observations/Objective:  - awake, alert; pleasant - lang fluent; comprehension intact   Assessment and Plan:  Dx:  1. Moderate dementia with behavioral disturbance (HCC)      MEMORY LOSS / DEMENTIA - ok to taper off meds; stop donepezil; wait 2 weeks; then reduce memantine to 10mg  daily; after 2 more weeks may stop memantine.   Follow Up Instructions:  - Return for return to PCP, pending if symptoms worsen or fail to improve.    I discussed the assessment and treatment plan with the patient. The patient was provided an opportunity to ask questions and all were answered. The patient agreed with the plan and demonstrated an understanding of the instructions.   The patient was advised to call back or seek an in-person evaluation if the symptoms worsen or if the condition fails to improve as anticipated.  I provided 15 minutes of non-face-to-face time during this encounter.   Penni Bombard, MD 07/16/2990, 4:26 PM Certified in Neurology, Neurophysiology and Neuroimaging  [facility] [address] [place], [place] [address] [phone]

## 2018-09-20 ENCOUNTER — Ambulatory Visit: Payer: Self-pay | Admitting: Diagnostic Neuroimaging

## 2018-10-05 ENCOUNTER — Other Ambulatory Visit: Payer: Self-pay | Admitting: Diagnostic Neuroimaging
